# Patient Record
Sex: Female | Born: 2000 | Race: White | Hispanic: No | Marital: Single | Smoking: Never smoker
Health system: Southern US, Community
[De-identification: ages and names within clinical notes are randomized; demographics above are authoritative.]

---
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
## 2019-01-13 ENCOUNTER — Other Ambulatory Visit: Payer: Self-pay

## 2019-01-13 ENCOUNTER — Emergency Department (HOSPITAL_COMMUNITY): Payer: Medicaid Other

## 2019-01-13 ENCOUNTER — Emergency Department (HOSPITAL_COMMUNITY)
Admission: EM | Admit: 2019-01-13 | Discharge: 2019-01-13 | Disposition: A | Discharge status: Home / Self Care | Payer: Medicaid Other | Attending: Emergency Medicine | Admitting: Emergency Medicine

## 2019-01-13 ENCOUNTER — Encounter (HOSPITAL_COMMUNITY): Payer: Self-pay | Admitting: Emergency Medicine

## 2019-01-13 DIAGNOSIS — W109XXA Fall (on) (from) unspecified stairs and steps, initial encounter: Secondary | ICD-10-CM | POA: Diagnosis not present

## 2019-01-13 DIAGNOSIS — S52502A Unspecified fracture of the lower end of left radius, initial encounter for closed fracture: Secondary | ICD-10-CM | POA: Insufficient documentation

## 2019-01-13 DIAGNOSIS — Y9389 Activity, other specified: Secondary | ICD-10-CM | POA: Insufficient documentation

## 2019-01-13 DIAGNOSIS — Y929 Unspecified place or not applicable: Secondary | ICD-10-CM | POA: Diagnosis not present

## 2019-01-13 DIAGNOSIS — Z79899 Other long term (current) drug therapy: Secondary | ICD-10-CM | POA: Insufficient documentation

## 2019-01-13 DIAGNOSIS — Y999 Unspecified external cause status: Secondary | ICD-10-CM | POA: Diagnosis not present

## 2019-01-13 DIAGNOSIS — S52612A Displaced fracture of left ulna styloid process, initial encounter for closed fracture: Secondary | ICD-10-CM

## 2019-01-13 DIAGNOSIS — S6992XA Unspecified injury of left wrist, hand and finger(s), initial encounter: Secondary | ICD-10-CM | POA: Diagnosis present

## 2019-01-13 MED ORDER — HYDROCODONE-ACETAMINOPHEN 5-325 MG PO TABS: 1.0000 | ORAL_TABLET | ORAL | 0 refills | Status: DC | PRN

## 2019-01-13 MED ORDER — IBUPROFEN 200 MG PO TABS
400.0000 mg | ORAL_TABLET | Freq: Once | ORAL | Status: AC
  Administered 2019-01-13: 400 mg via ORAL
  Filled 2019-01-13: qty 2

## 2019-01-13 MED ORDER — HYDROCODONE-ACETAMINOPHEN 5-325 MG PO TABS
1.0000 | ORAL_TABLET | Freq: Once | ORAL | Status: AC
  Administered 2019-01-13: 1 via ORAL
  Filled 2019-01-13: qty 1

## 2019-01-13 NOTE — ED Triage Notes (Signed)
The patient tripped while going down the stairs. While trying to catch herself she landed on her left arm.

## 2019-01-13 NOTE — ED Notes (Signed)
Bed: WTR8 Expected date:  Expected time:  Means of arrival:  Comments: 

## 2019-01-13 NOTE — Discharge Instructions (Signed)
Do not drive while taking the narcotic pain medication as it will make you sleepy. Follow up with the orthopedic doctor. Take ibuprofen in addition to the narcotic for pain and inflammation.

## 2019-01-13 NOTE — ED Provider Notes (Signed)
[facility]-EMERGENCY DEPT Provider Note   CSN: [id number] Arrival date & time: 01/13/19  1609     History   Chief Complaint Chief Complaint  Patient presents with  . Wrist Pain    HPI Audrey Robinson is a 18 y.o. female who presents to the ED with c/o wrist pain. Patient reports going down the stairs and tripped and tried to catch herself with her left arm and fell on her wrist. Patient denies any other injuries.  HPI  History reviewed. No pertinent past medical history.  There are no active problems to display for this patient.   History reviewed. No pertinent surgical history.   OB History   No obstetric history on file.      Home Medications    Prior to Admission medications   Medication Sig Start Date End Date Taking? Authorizing Provider  JUNEL FE 1/20 1-20 MG-MCG tablet Take 1 tablet by mouth daily. 08/04/18  Yes [provider]  HYDROcodone-acetaminophen (NORCO/VICODIN) 5-325 MG tablet Take 1 tablet by mouth every 4 (four) hours as needed. 01/13/19   Janne Napoleon, NP    Family History History reviewed. No pertinent family history.  Social History Social History   Tobacco Use  . Smoking status: Never Smoker  . Smokeless tobacco: Never Used  Substance Use Topics  . Alcohol use: Never    Frequency: Never  . Drug use: Never     Allergies   Patient has no known allergies.   Review of Systems Review of Systems  Musculoskeletal: Positive for arthralgias and joint swelling.  Skin: Positive for color change.  All other systems reviewed and are negative.    Physical Exam Updated Vital Signs BP (!) 136/51   Pulse 86   Temp 98.5 F (36.9 C) (Oral)   Resp 18   Ht 5\' 11"  (1.803 m)   Wt 77.1 kg   SpO2 100%   BMI 23.71 kg/m   Physical Exam Vitals signs and nursing note reviewed.  Constitutional:      General: She is not in acute distress.    Appearance: She is well-developed.  HENT:     Head: Normocephalic.    Nose: Nose normal.  Eyes:     Extraocular Movements: Extraocular movements intact.     Conjunctiva/sclera: Conjunctivae normal.  Neck:     Musculoskeletal: Normal range of motion and neck supple.  Cardiovascular:     Rate and Rhythm: Tachycardia present.  Pulmonary:     Effort: Pulmonary effort is normal.  Musculoskeletal:     Left wrist: She exhibits tenderness, bony tenderness and swelling. She exhibits no laceration. Decreased range of motion: due to pain.     Left hand: She exhibits normal capillary refill. Normal sensation noted. Normal strength noted. She exhibits no thumb/finger opposition.     Comments: Hematoma to the dorsum of the left wrist.   Skin:    General: Skin is warm and dry.  Neurological:     Mental Status: She is alert and oriented to person, place, and time.  Psychiatric:        Mood and Affect: Mood normal.      ED Treatments / Results  Labs (all labs ordered are listed, but only abnormal results are displayed) Labs Reviewed - No data to display  Radiology Dg Wrist Complete Left  Result Date: 01/13/2019 CLINICAL DATA:  The patient tripped while going down the stairs. While trying to catch herself she landed on her left arm. Swelling noted  to posterior left wrist. EXAM: LEFT WRIST - COMPLETE 3+ VIEW COMPARISON:  None. FINDINGS: There is a transverse fracture across the distal radial metaphysis. Fracture is nondisplaced. No significant angulation of the distal radial articular surface. No significant fracture impaction. There is no evidence of fracture involvement of the radial articular surface. Small associated fracture of the tip of the ulnar styloid. Joints are normally spaced and aligned. There is significant dorsal soft tissue swelling. IMPRESSION: 1. Transverse fracture of the distal radial metaphysis without significant displacement, impaction or angulation of the articular surface. Small associated fracture of the tip of the ulnar styloid. 2. No  dislocation. Electronically Signed   By: Amie Portland M.D.   On: 01/13/2019 17:15    Procedures Procedures (including critical care time)  Medications Ordered in ED Medications  HYDROcodone-acetaminophen (NORCO/VICODIN) 5-325 MG per tablet 1 tablet (has no administration in time range)  ibuprofen (ADVIL,MOTRIN) tablet 400 mg (400 mg Oral Given 01/13/19 1656)     Initial Impression / Assessment and Plan / ED Course  I have reviewed the triage vital signs and the nursing notes. 18 y.o. female here with left wrist pain s/p fall stable for d/c without focal neuro deficits. Placed in Sugar tong splint, ice, elevation and pain management. Return precautions discussed. Patient will return for any problems. She will check for circulation and is aware that if she has numbness, coldness or other problems in the fingers or hand to return immediately.  Final Clinical Impressions(s) / ED Diagnoses   Final diagnoses:  Closed fracture of distal end of left radius, unspecified fracture morphology, initial encounter  Traumatic closed fracture of ulnar styloid with minimal displacement, left, initial encounter    ED Discharge Orders         Ordered    HYDROcodone-acetaminophen (NORCO/VICODIN) 5-325 MG tablet  Every 4 hours PRN     01/13/19 1807           Kerrie [place], NP 01/13/19 1813    Tegeler, Canary Brim, MD 01/13/19 [phone]

## 2019-09-23 IMAGING — CR DG WRIST COMPLETE 3+V*L*
4 series · 4 of 4 positions shown · non-contrast
Comparison: None.

CLINICAL DATA: The patient tripped while going down the stairs.
While trying to catch herself she landed on her left arm. Swelling
noted to posterior left wrist.

EXAM:
LEFT WRIST - COMPLETE 3+ VIEW

[x wrist pa left]
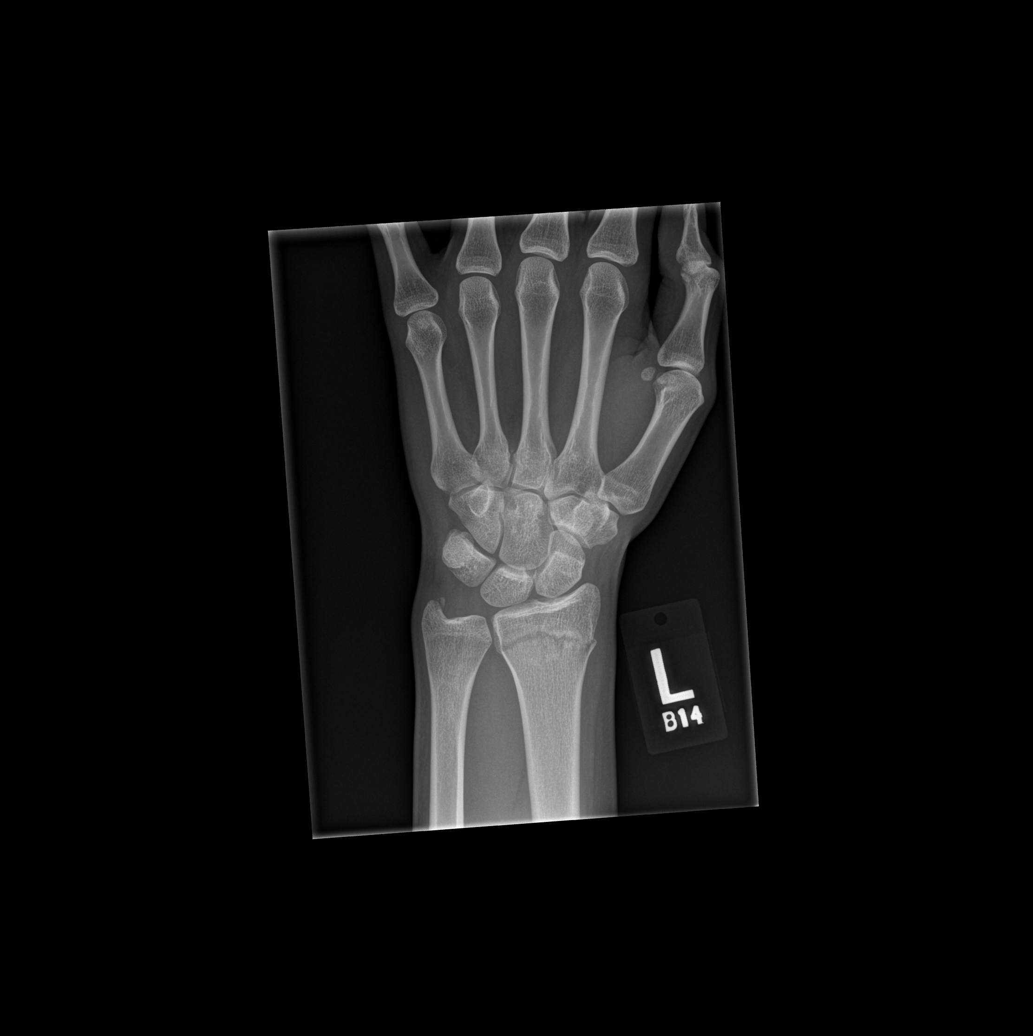

[x wrist obl left]
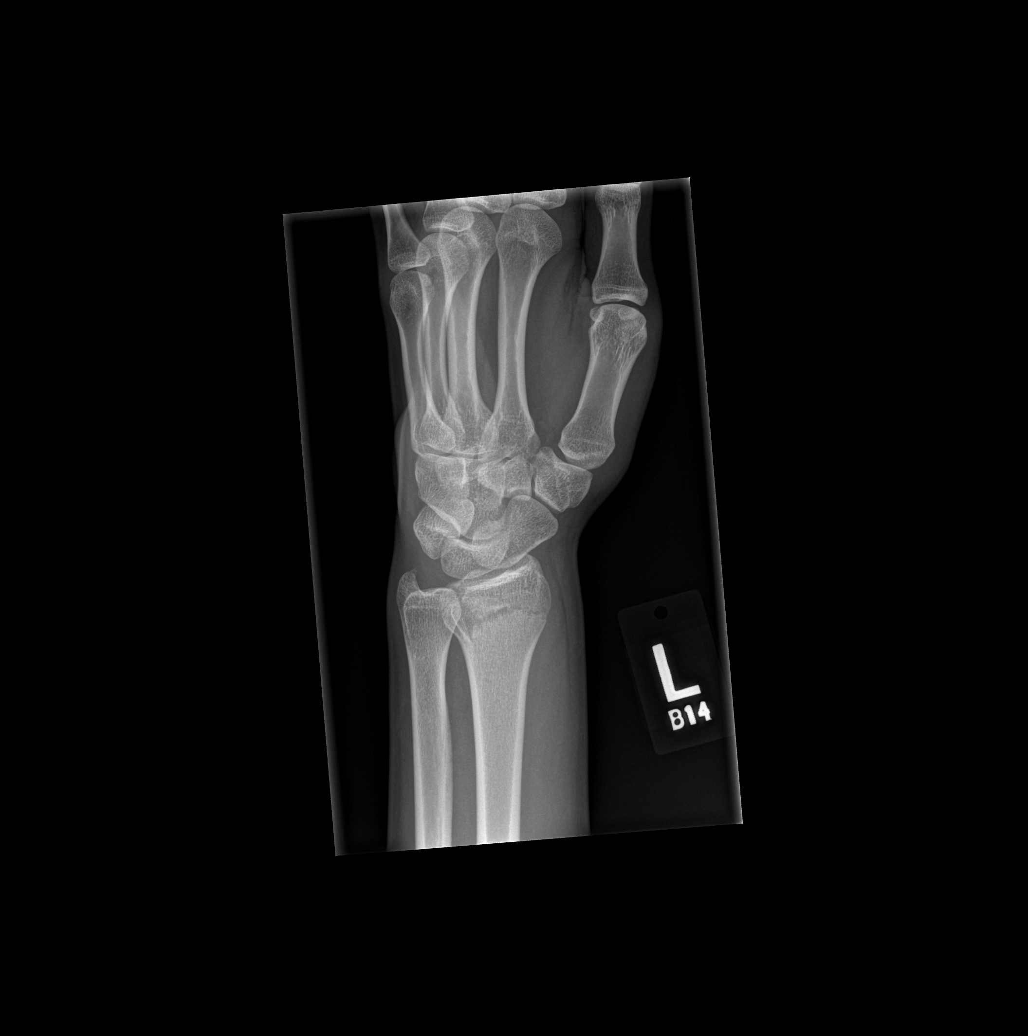

[x wrist lat left]
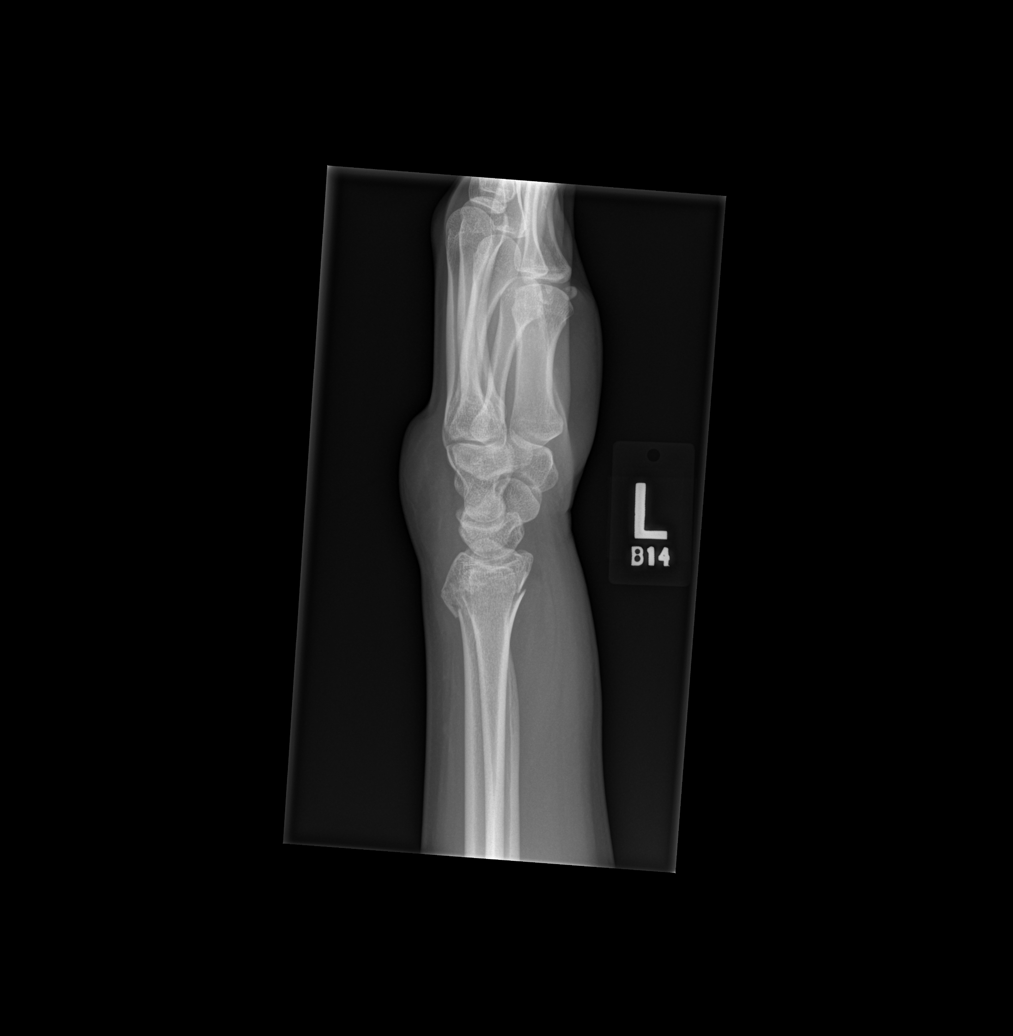

[x wrist navicular view left]
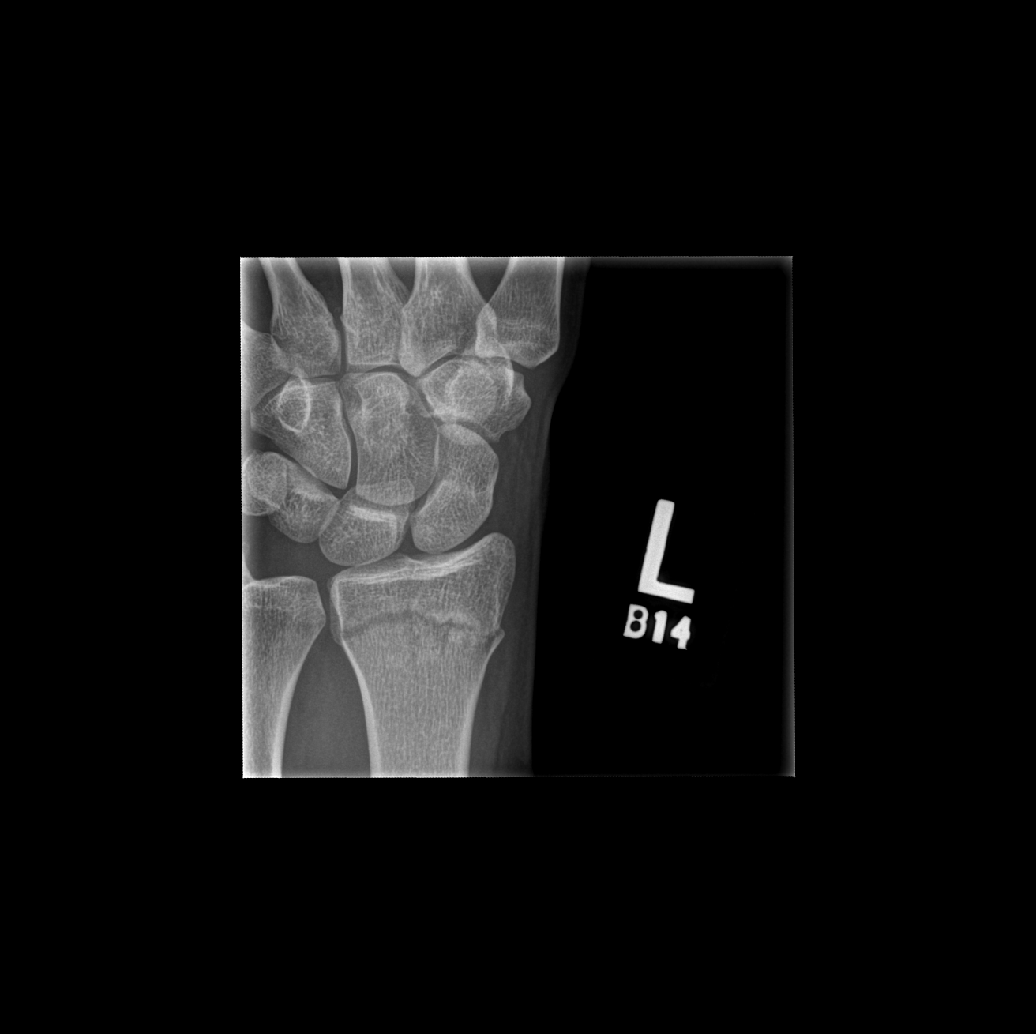

[4 of 4 positions shown; findings below may reference images not displayed]

FINDINGS: There is a transverse fracture across the distal radial metaphysis.
Fracture is nondisplaced. No significant angulation of the distal
radial articular surface. No significant fracture impaction. There
is no evidence of fracture involvement of the radial articular
surface.

Small associated fracture of the tip of the ulnar styloid.

Joints are normally spaced and aligned.

There is significant dorsal soft tissue swelling.
IMPRESSION: 1. Transverse fracture of the distal radial metaphysis without
significant displacement, impaction or angulation of the articular
surface. Small associated fracture of the tip of the ulnar styloid.
2. No dislocation.

## 2020-03-06 ENCOUNTER — Ambulatory Visit: Payer: Medicaid Other | Attending: Internal Medicine

## 2020-03-06 DIAGNOSIS — Z23 Encounter for immunization: Secondary | ICD-10-CM

## 2020-03-06 NOTE — Progress Notes (Signed)
   Covid-19 Vaccination Clinic  Name:  Audrey Robinson    MRN: [id number] DOB: 2001/01/16  03/06/2020  [name] was observed post Covid-19 immunization for 15 minutes without incident. She was provided with Vaccine Information Sheet and instruction to access the V-Safe system.   [name] was instructed to call 911 with any severe reactions post vaccine: Marland Kitchen Difficulty breathing  . Swelling of face and throat  . A fast heartbeat  . A bad rash all over body  . Dizziness and weakness   Immunizations Administered    Name Date Dose VIS Date Route   Pfizer COVID-19 Vaccine 03/06/2020  3:58 PM 0.3 mL 11/23/2019 Intramuscular   Manufacturer: [employer], [employer]   Lot: [id number]   NDC: [address]-8

## 2020-03-31 ENCOUNTER — Ambulatory Visit: Payer: Medicaid Other | Attending: Internal Medicine

## 2020-03-31 DIAGNOSIS — Z23 Encounter for immunization: Secondary | ICD-10-CM

## 2020-03-31 NOTE — Progress Notes (Signed)
   Covid-19 Vaccination Clinic  Name:  Aujanae Mccullum    MRN: [id number] DOB: [birth year]  03/31/2020  Ms. Walczyk was observed post Covid-19 immunization for 15 minutes without incident. She was provided with Vaccine Information Sheet and instruction to access the V-Safe system.   Ms. Briney was instructed to call 911 with any severe reactions post vaccine: Marland Kitchen Difficulty breathing  . Swelling of face and throat  . A fast heartbeat  . A bad rash all over body  . Dizziness and weakness   Immunizations Administered    Name Date Dose VIS Date Route   Pfizer COVID-19 Vaccine 03/31/2020  2:02 PM 0.3 mL 02/06/2019 Intramuscular   Manufacturer: [employer], [employer]   Lot: [id number]   NDC: [address]-0

## 2024-10-22 ENCOUNTER — Ambulatory Visit
Admission: EM | Admit: 2024-10-22 | Discharge: 2024-10-22 | Disposition: A | Discharge status: Home / Self Care | Attending: Family Medicine | Admitting: Family Medicine

## 2024-10-22 DIAGNOSIS — L0211 Cutaneous abscess of neck: Secondary | ICD-10-CM

## 2024-10-22 MED ORDER — DOXYCYCLINE HYCLATE 100 MG PO CAPS: 100.0000 mg | ORAL_CAPSULE | Freq: Two times a day (BID) | ORAL | 0 refills | Status: AC

## 2024-10-22 NOTE — ED Triage Notes (Signed)
 Pt present with c/o a cyst on her neck. Pt states she has had the cyst for a few years. States it started swelling more on Friday. Pt expresses pain and discomfort. Pt c/o pain to touch or when something is rubbing against the cyst.

## 2024-10-22 NOTE — Discharge Instructions (Addendum)
 Advised patient to leave abscessed area open to air after 24 hours and allow healing by secondary intention/scab formation.  Advised patient take medication as directed with food to completion.  Encouraged to increase daily water intake to 64 ounces per day while taking this medication.  Advised if symptoms worsen and/or unresolved please follow-up with your general surgery, PCP, or here for further evaluation.

## 2024-10-22 NOTE — ED Provider Notes (Signed)
 [facility]    CSN: [id number] Arrival date & time: 10/22/24  1138      History   Chief Complaint Chief Complaint  Patient presents with  . Abscess    HPI Audrey Robinson is a 23 y.o. female.   HPI pleasant 23 year old female presents with cyst of neck.  Patient reports cyst has been present for 3 years.  Reports swelling began on Friday of last week.  History reviewed. No pertinent past medical history.  There are no active problems to display for this patient.   History reviewed. No pertinent surgical history.  OB History   No obstetric history on file.      Home Medications    Prior to Admission medications   Medication Sig Start Date End Date Taking? Authorizing Provider  doxycycline (VIBRAMYCIN) 100 MG capsule Take 1 capsule (100 mg total) by mouth 2 (two) times daily for 10 days. 10/22/24 11/01/24 Yes Teddy Sharper, FNP    Family History History reviewed. No pertinent family history.  Social History Social History   Tobacco Use  . Smoking status: Never  . Smokeless tobacco: Never  Substance Use Topics  . Alcohol use: Never  . Drug use: Never     Allergies   Patient has no known allergies.   Review of Systems Review of Systems  Skin:        Cutaneous abscess of neck x 3 years     Physical Exam Triage Vital Signs ED Triage Vitals  Encounter Vitals Group     BP 10/22/24 1203 112/74     Girls Systolic BP Percentile --      Girls Diastolic BP Percentile --      Boys Systolic BP Percentile --      Boys Diastolic BP Percentile --      Pulse Rate 10/22/24 1203 73     Resp 10/22/24 1203 18     Temp 10/22/24 1203 98.5 F (36.9 C)     Temp src --      SpO2 10/22/24 1203 100 %     Weight --      Height --      Head Circumference --      Peak Flow --      Pain Score 10/22/24 1201 0     Pain Loc --      Pain Education --      Exclude from Growth Chart --    No data found.  Updated Vital Signs BP 112/74   Pulse 73    Temp 98.5 F (36.9 C)   Resp 18   LMP 09/21/2024 (Approximate)   SpO2 100%    Physical Exam Vitals and nursing note reviewed.  Constitutional:      Appearance: Normal appearance. She is normal weight.  HENT:     Head: Normocephalic and atraumatic.     Mouth/Throat:     Mouth: Mucous membranes are moist.     Pharynx: Oropharynx is clear.  Eyes:     Extraocular Movements: Extraocular movements intact.     Pupils: Pupils are equal, round, and reactive to light.  Cardiovascular:     Rate and Rhythm: Normal rate and regular rhythm.     Pulses: Normal pulses.     Heart sounds: Normal heart sounds.  Pulmonary:     Effort: Pulmonary effort is normal.     Breath sounds: Normal breath sounds. No wheezing, rhonchi or rales.  Musculoskeletal:        General: Normal  range of motion.  Skin:    General: Skin is warm and dry.     Comments: Neck (anterior inferior aspect left-sided):~2.0 cm x 2.0 cm pustular abscess on erythematous base please see image below, post I&D procedure image below as well  Neurological:     General: No focal deficit present.     Mental Status: She is alert and oriented to person, place, and time. Mental status is at baseline.      UC Treatments / Results  Labs (all labs ordered are listed, but only abnormal results are displayed) Labs Reviewed - No data to display  EKG   Radiology No results found.  Procedures Incision and Drainage  Date/Time: 10/22/2024 1:18 PM  Performed by: Teddy Sharper, FNP Authorized by: Teddy Sharper, FNP   Consent:    Consent obtained:  Verbal   Consent given by:  Patient   Risks, benefits, and alternatives were discussed: yes     Alternatives discussed:  No treatment Universal protocol:    Patient identity confirmed:  Verbally with patient Location:    Type:  Pilonidal cyst   Size:  2.0   Location:  Neck Pre-procedure details:    Skin preparation:  Chlorhexidine with alcohol Sedation:    Sedation type:   None Anesthesia:    Anesthesia method:  Local infiltration   Local anesthetic:  Lidocaine 1% w/o epi Procedure type:    Complexity:  Simple Procedure details:    Ultrasound guidance: no     Needle aspiration: no     Incision types:  Stab incision   Incision depth:  Dermal   Drainage:  Purulent   Drainage amount:  Moderate   Wound treatment:  Wound left open Post-procedure details:    Procedure completion:  Tolerated well, no immediate complications Comments:     Moderate, copious amount of mucopurulent discharge expressed during procedure  (including critical care time)  Medications Ordered in UC Medications - No data to display  Initial Impression / Assessment and Plan / UC Course  I have reviewed the triage vital signs and the nursing notes.  Pertinent labs & imaging results that were available during my care of the patient were reviewed by me and considered in my medical decision making (see chart for details).     MDM: 1.  Cutaneous abscess of the neck-please see I&D procedure note, Rx'd doxycycline 100 mg capsule: Take 1 capsule twice daily x 10 days. Advised patient to leave abscessed area open to air after 24 hours and allow healing by secondary intention/scab formation.  Advised patient take medication as directed with food to completion.  Encouraged to increase daily water intake to 64 ounces per day while taking this medication.  Advised if symptoms worsen and/or unresolved please follow-up with your general surgery, PCP, or here for further evaluation. Final Clinical Impressions(s) / UC Diagnoses   Final diagnoses:  Cutaneous abscess of neck     Discharge Instructions      Advised patient to leave abscessed area open to air after 24 hours and allow healing by secondary intention/scab formation.  Advised patient take medication as directed with food to completion.  Encouraged to increase daily water intake to 64 ounces per day while taking this medication.  Advised  if symptoms worsen and/or unresolved please follow-up with your general surgery, PCP, or here for further evaluation.     ED Prescriptions     Medication Sig Dispense Auth. Provider   doxycycline (VIBRAMYCIN) 100 MG capsule Take 1 capsule (  100 mg total) by mouth 2 (two) times daily for 10 days. 20 capsule Ayauna Mcnay, FNP      PDMP not reviewed this encounter.   Teddy Sharper, FNP 10/22/24 1328
